# Patient Record
Sex: Female | Born: 1995 | Hispanic: Yes | Marital: Married | Smoking: Never smoker
Health system: Southern US, Community
[De-identification: ages and names within clinical notes are randomized; demographics above are authoritative.]

## PROBLEM LIST (undated history)

## (undated) DIAGNOSIS — R519 Headache, unspecified: Secondary | ICD-10-CM

## (undated) DIAGNOSIS — K219 Gastro-esophageal reflux disease without esophagitis: Secondary | ICD-10-CM

## (undated) HISTORY — DX: Headache, unspecified: R51.9

## (undated) HISTORY — PX: NO PAST SURGERIES: SHX2092

## (undated) HISTORY — DX: Gastro-esophageal reflux disease without esophagitis: K21.9

---

## 2018-03-21 ENCOUNTER — Emergency Department: Payer: Medicaid Other

## 2018-03-21 ENCOUNTER — Emergency Department
Admission: EM | Admit: 2018-03-21 | Discharge: 2018-03-21 | Disposition: A | Discharge status: Home / Self Care | Payer: Medicaid Other | Attending: Emergency Medicine | Admitting: Emergency Medicine

## 2018-03-21 ENCOUNTER — Other Ambulatory Visit: Payer: Self-pay

## 2018-03-21 DIAGNOSIS — O3410 Maternal care for benign tumor of corpus uteri, unspecified trimester: Secondary | ICD-10-CM

## 2018-03-21 DIAGNOSIS — O4690 Antepartum hemorrhage, unspecified, unspecified trimester: Secondary | ICD-10-CM

## 2018-03-21 DIAGNOSIS — D259 Leiomyoma of uterus, unspecified: Secondary | ICD-10-CM | POA: Diagnosis not present

## 2018-03-21 DIAGNOSIS — O209 Hemorrhage in early pregnancy, unspecified: Secondary | ICD-10-CM | POA: Diagnosis present

## 2018-03-21 DIAGNOSIS — Z3A13 13 weeks gestation of pregnancy: Secondary | ICD-10-CM | POA: Diagnosis not present

## 2018-03-21 DIAGNOSIS — O9989 Other specified diseases and conditions complicating pregnancy, childbirth and the puerperium: Secondary | ICD-10-CM | POA: Insufficient documentation

## 2018-03-21 LAB — HCG, QUANTITATIVE, PREGNANCY: hCG, Beta Chain, Quant, S: 69189 m[IU]/mL — ABNORMAL HIGH (ref <5)

## 2018-03-21 LAB — POCT PREGNANCY, URINE: Preg Test, Ur: POSITIVE — AB

## 2018-03-21 LAB — ABO/RH: ABO/RH(D): O POS

## 2018-03-21 NOTE — ED Provider Notes (Signed)
[facility] Emergency Department Provider Note  ____________________________________________  Time seen: Approximately 10:57 PM  I have reviewed the triage vital signs and the nursing notes.   HISTORY  Chief Complaint Vaginal Bleeding    HPI Caroline Cooke is a 22 y.o. female who presents to the emergency department for treatment and evaluation of vaginal bleeding.  Patient is  approximately [redacted] weeks pregnant.  Her last menstrual cycle was 2 months ago.  Positive home pregnancy test 1 month ago.  She has not seen OB/GYN.  Gravida 1 para 0  History reviewed. No pertinent past medical history.  There are no active problems to display for this patient.   History reviewed. No pertinent surgical history.  Prior to Admission medications   Not on File    Allergies Patient has no allergy information on record.  No family history on file.  Social History Social History   Tobacco Use  . Smoking status: Not on file  Substance Use Topics  . Alcohol use: Not on file  . Drug use: Not on file    Review of Systems Constitutional: Negative for fever. Respiratory: Negative for shortness of breath or cough. Gastrointestinal: Negative for abdominal pain; negative for nausea , negative for vomiting. Genitourinary: Negative for dysuria , positive for vaginal bleeding. Musculoskeletal: Negative for back pain. Skin: Negative for acute skin changes/rash/lesion. ____________________________________________   PHYSICAL EXAM:  VITAL SIGNS: ED Triage Vitals  Enc Vitals Group     BP 03/21/18 2024 119/67     Pulse Rate 03/21/18 2024 93     Resp 03/21/18 2024 18     Temp 03/21/18 2024 99.9 F (37.7 C)     Temp Source 03/21/18 2024 Oral     SpO2 03/21/18 2024 100 %     Weight 03/21/18 2024 155 lb (70.3 kg)     Height 03/21/18 2024 5\' 1"  (1.549 m)     Head Circumference --      Peak Flow --      Pain Score 03/21/18 2025 0     Pain Loc --      Pain  Edu? --      Excl. in [place]? --     Constitutional: Alert and oriented. Well appearing and in no acute distress. Eyes: Conjunctivae are normal. Head: Atraumatic. Nose: No congestion/rhinnorhea. Mouth/Throat: Mucous membranes are moist. Respiratory: Normal respiratory effort.  No retractions. Gastrointestinal: Bowel sounds active x 4; Abdomen is soft without rebound or guarding. Genitourinary: Pelvic exam: Deferred Musculoskeletal: No extremity tenderness nor edema.  Neurologic:  Normal speech and language. No gross focal neurologic deficits are appreciated. Speech is normal. No gait instability. Skin:  Skin is warm, dry and intact. No rash noted on exposed skin. Psychiatric: Mood and affect are normal. Speech and behavior are normal.  ____________________________________________   LABS (all labs ordered are listed, but only abnormal results are displayed)  Labs Reviewed  HCG, QUANTITATIVE, PREGNANCY - Abnormal; Notable for the following components:      Result Value   hCG, Beta Chain, Quant, S 69,189 (*)    All other components within normal limits  POCT PREGNANCY, URINE - Abnormal; Notable for the following components:   Preg Test, Ur POSITIVE (*)    All other components within normal limits  POC URINE PREG, ED  ABO/RH   ____________________________________________  RADIOLOGY  OB ultrasound shows a single live IUP 13 weeks 4 days with heart rate of 163, cardiac activity is present, intrauterine gestational sac is also present.  No visualized subchorionic hemorrhage.  There is a small 3.7 cm hypoechoic left probable intramural leiomyoma. ____________________________________________  Procedures  ____________________________________________  22 year old female presenting to the emergency department for treatment and evaluation of vaginal bleeding during pregnancy.  She thought she was [redacted] weeks pregnant, however according to ultrasound she is 13 weeks and 4 days.  Ultrasound does  show a 3.7 cm lesion which likely resembles a leiomyoma.  Patient was advised to begin prenatal vitamins, pelvic rest was advised, and she was advised to follow-up with OB/GYN.  Strict ER return precautions were given and she will return for any heavy vaginal bleeding or passing clots if she is unable to see gynecology.  INITIAL IMPRESSION / ASSESSMENT AND PLAN / ED COURSE  Pertinent labs & imaging results that were available during my care of the patient were reviewed by me and considered in my medical decision making (see chart for details).  ____________________________________________   FINAL CLINICAL IMPRESSION(S) / ED DIAGNOSES  Final diagnoses:  Vaginal bleeding in pregnancy  Uterine fibroid in pregnancy    Note:  This document was prepared using Dragon voice recognition software and may include unintentional dictation errors.    Victorino Dike, FNP 03/21/18 [place], [place], MD 03/24/18 [phone]

## 2018-03-21 NOTE — ED Notes (Signed)
Patient transported to Ultrasound 

## 2018-03-21 NOTE — ED Notes (Signed)
Pt assisted to wheelchair by family upon arrival; pt reports vaginal bleeding; unsure if she is pregnant

## 2018-03-21 NOTE — ED Triage Notes (Signed)
Pt comes via POV from home with c/o vaginal bleeding that started about 30 minutes ago. Pt reports cramping and clots.  Pt reports last period about 2 months ago. Pt reports positive home pregnancy test taken about a month ago. Pt has not seen OBGYN since positive pregnancy test.

## 2018-05-14 ENCOUNTER — Encounter: Payer: Self-pay | Admitting: Radiology

## 2020-02-12 IMAGING — US US OB COMP LESS 14 WK
1 series · 14 of 28 positions shown · non-contrast
Comparison: None.

CLINICAL DATA: Vaginal bleeding. Gestational age by last menstrual
period 9 weeks and 5 days.

EXAM:
OBSTETRIC <14 WK ULTRASOUND
TECHNIQUE: Transabdominal ultrasound was performed for evaluation of the
gestation as well as the maternal uterus and adnexal regions.

[Series 1: us ob comp less 14 wk · 14 of 55 slices shown]
[im 3/55]
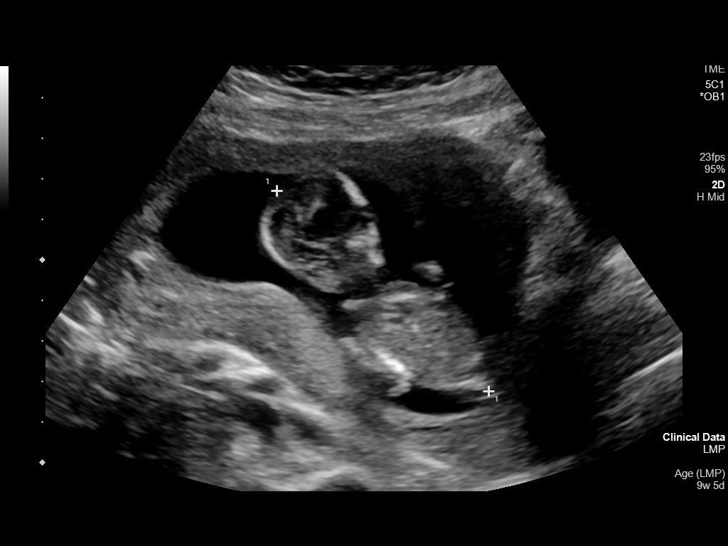
[im 7/55]
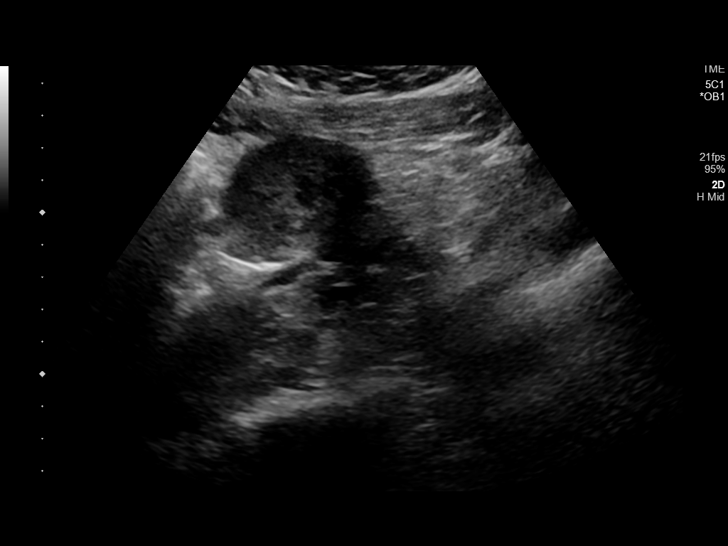
[im 11/55]
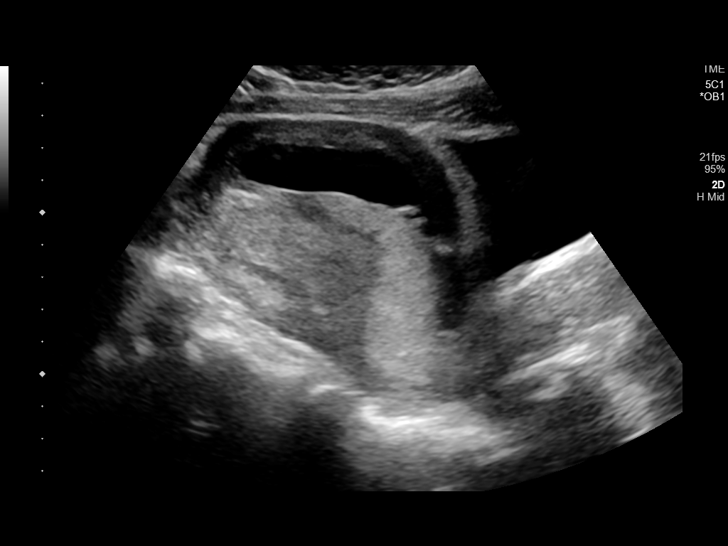
[im 15/55]
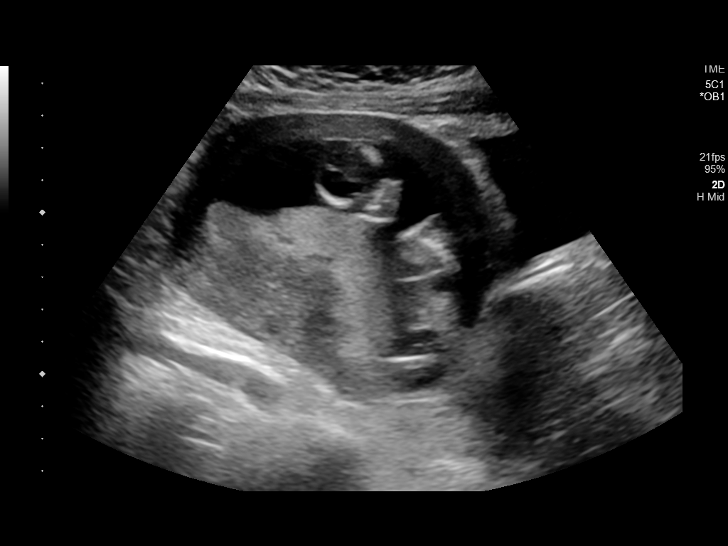
[im 19/55]
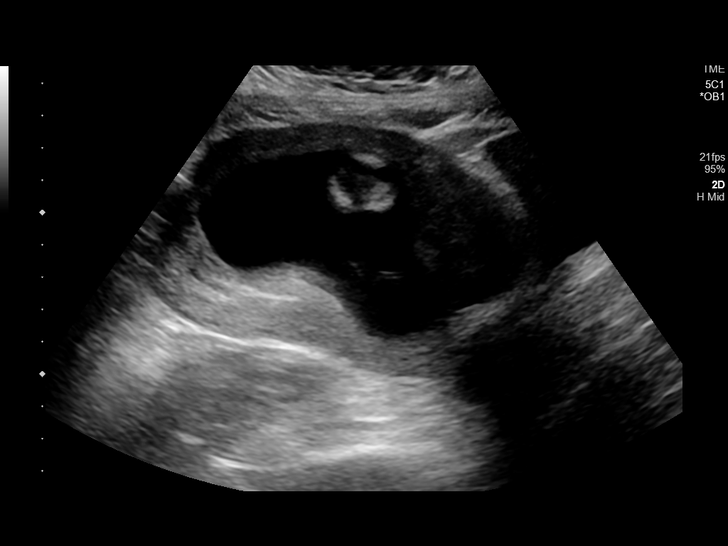
[im 23/55]
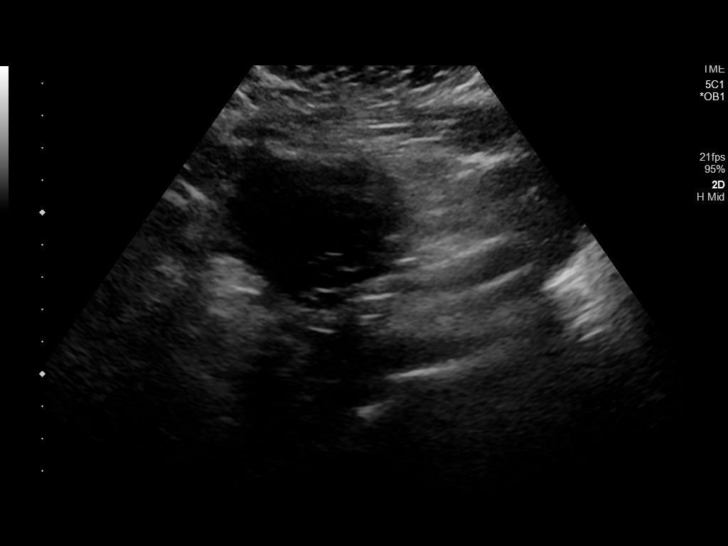
[im 27/55]
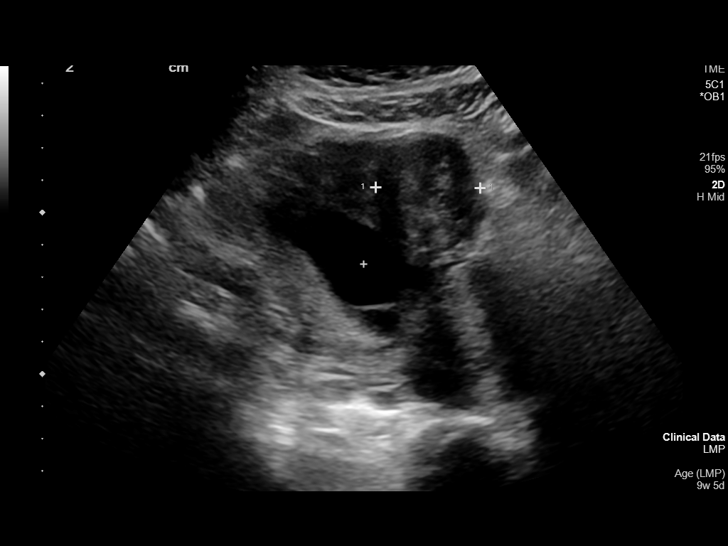
[im 31/55]
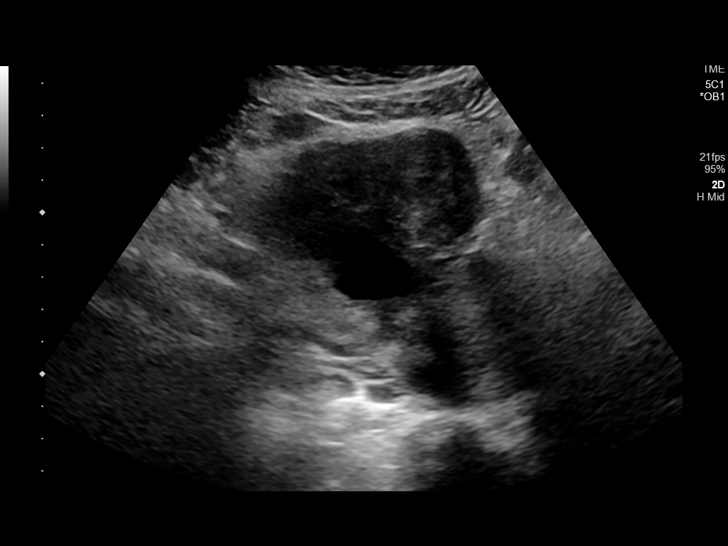
[im 35/55]
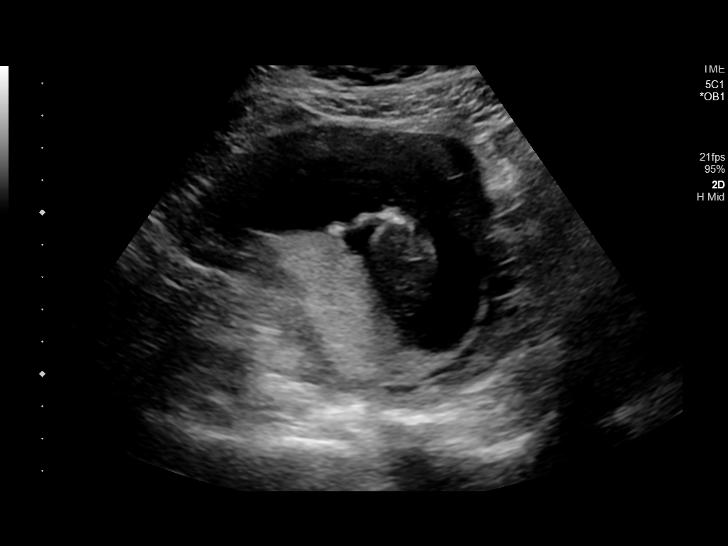
[im 39/55]
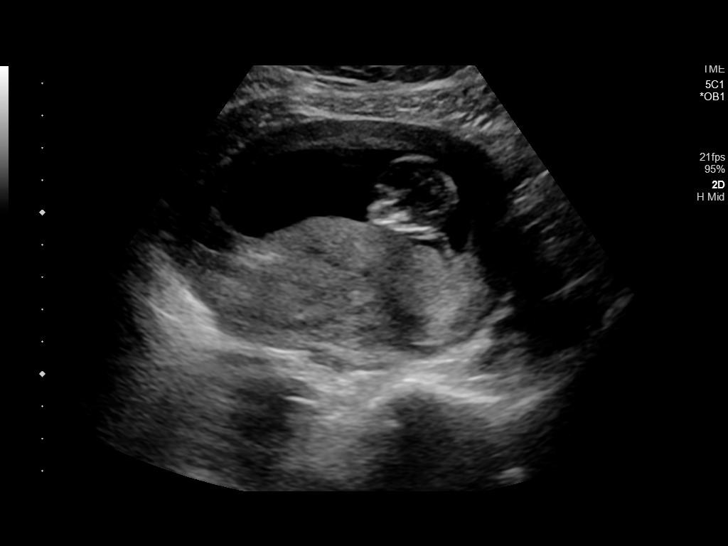
[im 43/55]
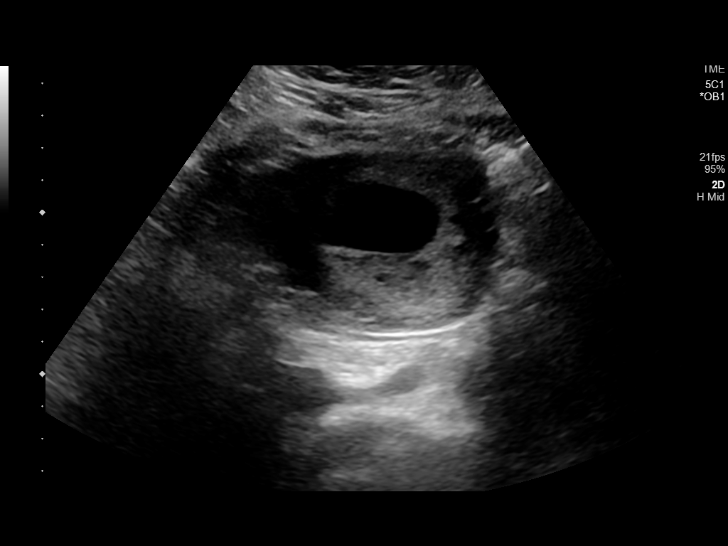
[im 47/55]
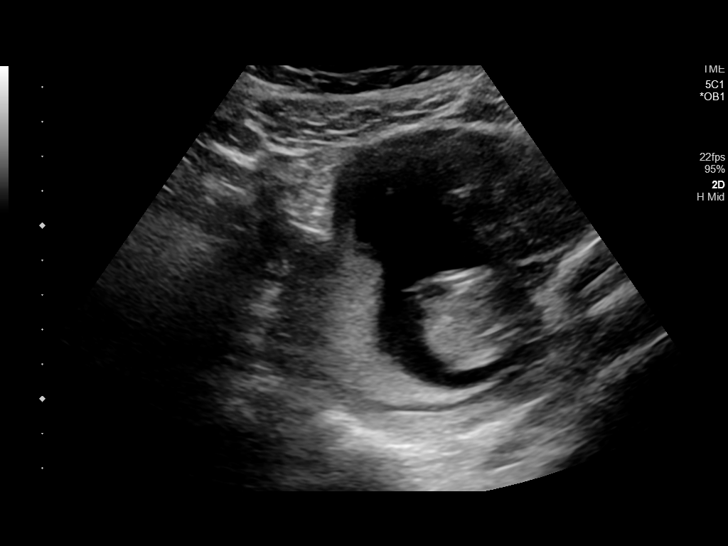
[im 51/55]
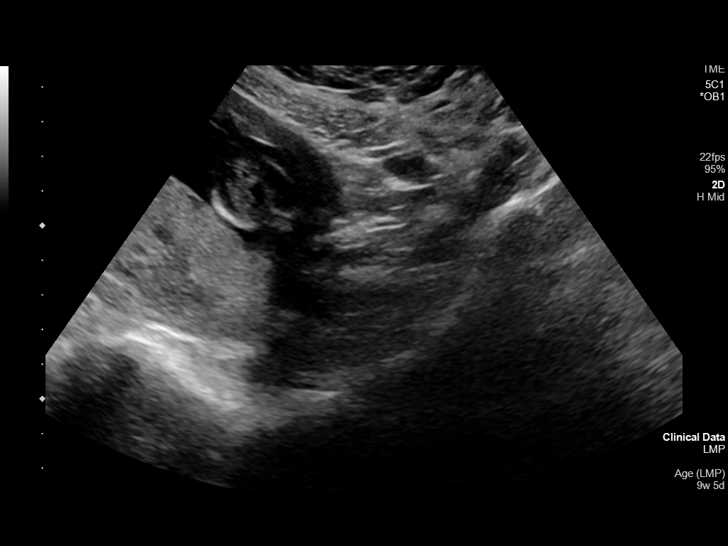
[im 55/55]
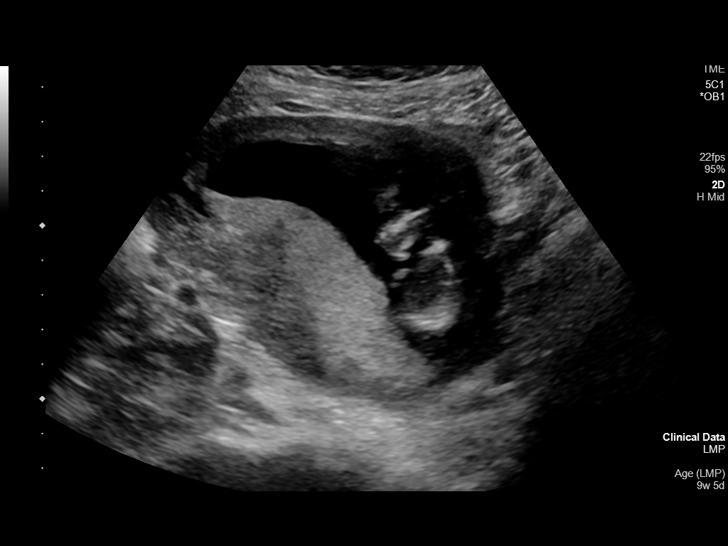

[14 of 28 positions shown; findings below may reference images not displayed]

FINDINGS: Intrauterine gestational sac: Present.

Yolk sac:  Not present.

Embryo:  Present.

Cardiac Activity: Present.

Heart Rate: 163 bpm

CRL:   73.9 mm   13 w for d                  US EDC: September 22, 2018

Subchorionic hemorrhage:  None visualized.

Maternal uterus/adnexae: Posterior placenta. 3.7 cm hypoechoic LEFT
probable intramural leiomyoma. Ovaries not sonographically
identified. No free fluid.
IMPRESSION: Single live intrauterine pregnancy, gestational age by ultrasound 13
weeks and 4 days without immediate complication.

## 2020-11-17 ENCOUNTER — Other Ambulatory Visit: Payer: Self-pay

## 2020-11-17 ENCOUNTER — Ambulatory Visit (LOCAL_COMMUNITY_HEALTH_CENTER): Payer: Medicaid Other

## 2020-11-17 VITALS — BP 112/77 | HR 80 | Resp 16 | Ht 60.0 in | Wt 167.0 lb

## 2020-11-17 DIAGNOSIS — Z3201 Encounter for pregnancy test, result positive: Secondary | ICD-10-CM

## 2020-11-17 LAB — PREGNANCY, URINE: Preg Test, Ur: POSITIVE — AB

## 2020-11-17 MED ORDER — PRENATAL 27-0.8 MG PO TABS: 1.0000 | ORAL_TABLET | Freq: Every day | ORAL | 0 refills | Status: AC

## 2020-11-17 NOTE — Progress Notes (Signed)
UPT positive. Plans prenatal care at [facility]. To clerk for preadmit. 

## 2020-12-15 NOTE — Progress Notes (Signed)
OB abstract completed per phone interview 12/15/20.  Pt aware of new OB appt scheduled for 12/22/20 8am Milagros Reap RN

## 2020-12-22 ENCOUNTER — Ambulatory Visit: Payer: Medicaid Other | Admitting: Family Medicine

## 2020-12-22 ENCOUNTER — Encounter: Payer: Self-pay | Admitting: Family Medicine

## 2020-12-22 ENCOUNTER — Other Ambulatory Visit: Payer: Self-pay

## 2020-12-22 VITALS — BP 110/74 | HR 77 | Temp 97.7°F | Ht 60.5 in | Wt 166.0 lb

## 2020-12-22 DIAGNOSIS — O99211 Obesity complicating pregnancy, first trimester: Secondary | ICD-10-CM

## 2020-12-22 DIAGNOSIS — O0990 Supervision of high risk pregnancy, unspecified, unspecified trimester: Secondary | ICD-10-CM | POA: Insufficient documentation

## 2020-12-22 DIAGNOSIS — Z3481 Encounter for supervision of other normal pregnancy, first trimester: Secondary | ICD-10-CM | POA: Diagnosis not present

## 2020-12-22 DIAGNOSIS — O99210 Obesity complicating pregnancy, unspecified trimester: Secondary | ICD-10-CM

## 2020-12-22 DIAGNOSIS — Z3480 Encounter for supervision of other normal pregnancy, unspecified trimester: Secondary | ICD-10-CM

## 2020-12-22 LAB — URINALYSIS
Bilirubin, UA: NEGATIVE
Glucose, UA: NEGATIVE
Ketones, UA: NEGATIVE
Leukocytes,UA: NEGATIVE
Nitrite, UA: NEGATIVE
Protein,UA: NEGATIVE
RBC, UA: NEGATIVE
Specific Gravity, UA: 1.02 (ref 1.005–1.030)
Urobilinogen, Ur: 0.2 mg/dL (ref 0.2–1.0)
pH, UA: 7 (ref 5.0–7.5)

## 2020-12-22 LAB — HEMOGLOBIN, FINGERSTICK: Hemoglobin: 13.6 g/dL (ref 11.1–15.9)

## 2020-12-22 LAB — WET PREP FOR TRICH, YEAST, CLUE
Trichomonas Exam: NEGATIVE
Yeast Exam: NEGATIVE

## 2020-12-22 NOTE — Progress Notes (Signed)
Patient hgb, urine dip, wet mount reviewed, no new orders. Dental list given and patient counseled to make a dental appointment for routine dental care. Patient counseled to expect phone call from [facility] for U/S appointment. Copy of covid card sent for scanning.Jenetta Downer, RN

## 2020-12-22 NOTE — Progress Notes (Signed)
Patient here for new OB visit at 11 weeks. Last Pap 05/01/2018, NIL. Normal Hgb, 05/01/2018. Denies pregnancy care or U/S during this appt. Lives with husband and 26 year old. PPDR in 2017, 22m, denies travel since then..Marland KitchenMarland KitchenJenetta Downer RN

## 2020-12-22 NOTE — Progress Notes (Signed)
[place] DEPT [facility] [place] [address] [phone]  INITIAL PRENATAL VISIT NOTE  Subjective:  Caroline Cooke is a 25 y.o. G2P1001 at 71w0dbeing [facility] today [facility]  She is currently monitored for the following issues for this low-risk pregnancy and has Supervision of other normal pregnancy, antepartum and Obesity during pregnancy, antepartum on their problem list.  Patient reports nausea.  Contractions: Not present. Vag. Bleeding: None.  Movement: Absent. Denies leaking of fluid.   Indications for ASA therapy (per uptodate) One of the following: Previous pregnancy with preeclampsia, especially early onset and with an adverse outcome No Multifetal gestation No Chronic hypertension No Type 1 or 2 diabetes mellitus No Chronic kidney disease No Autoimmune disease (antiphospholipid syndrome, systemic lupus erythematosus) No  Two or more of the following: Nulliparity No Obesity (body mass index >30 kg/m2) Yes Family history of preeclampsia in mother or sister No Age ?35 years No Sociodemographic characteristics (African American race, low socioeconomic level) Yes Personal risk factors (eg, previous pregnancy with low birth weight or small for gestational age infant, previous adverse pregnancy outcome [eg, stillbirth], interval >10 years between pregnancies) No   The following portions of the patient's history were reviewed and updated as appropriate: allergies, current medications, past family history, past medical history, past social history, past surgical history and problem list. Problem list updated.  Objective:   Vitals:   12/22/20 0941 12/22/20 0950  BP: 110/74   Pulse: 77   Temp: 97.7 F (36.5 C)   Weight: 166 lb (75.3 kg)   Height:  5' 0.5" (1.537 m)    Fetal Status:   Fundal Height: 11 cm Movement: Absent  Presentation:  Undeterminable   Physical Exam Vitals and nursing note reviewed.  Constitutional:      General: She is not in acute distress.    Appearance: Normal appearance. She is well-developed.  HENT:     Head: Normocephalic and atraumatic.     Right Ear: External ear normal.     Left Ear: External ear normal.     Nose: Nose normal. No congestion or rhinorrhea.     Mouth/Throat:     Lips: Pink.     Mouth: Mucous membranes are moist.     Dentition: Normal dentition. No dental caries.     Pharynx: Oropharynx is clear. Uvula midline.     Comments: Dentition: no visible caries  Eyes:     General: No scleral icterus.    Conjunctiva/sclera: Conjunctivae normal.  Neck:     Thyroid: No thyroid mass or thyromegaly.  Cardiovascular:     Rate and Rhythm: Normal rate and regular rhythm.     Pulses: Normal pulses.     Heart sounds: Normal heart sounds.     Comments: Extremities are warm and well perfused Pulmonary:     Effort: Pulmonary effort is normal.     Breath sounds: Normal breath sounds.  Chest:     Chest wall: No mass.  Breasts:    Tanner Score is 5.     Breasts are symmetrical.     Right: Normal. No mass, nipple discharge, skin change or axillary adenopathy.     Left: Normal. No mass, nipple discharge, skin change or axillary adenopathy.  Abdominal:     General: Abdomen is flat.     Palpations: Abdomen is soft.     Tenderness: There is no abdominal tenderness.  Comments: Gravid   Genitourinary:    General: Normal vulva.     Exam position: Lithotomy position.     Pubic Area: No rash.      Labia:        Right: No rash.        Left: No rash.      Vagina: Normal. No vaginal discharge.     Cervix: No cervical motion tenderness or friability.     Uterus: Normal. Enlarged (Gravid 11 size). Not tender.      Adnexa: Right adnexa normal and left adnexa normal.     Rectum: Normal. No external hemorrhoid.     Comments: External genitalia without, lice, nits, erythema, edema , lesions  or inguinal adenopathy. Vagina with normal mucosa and white discharge and pH equals 4.  Cervix without visual lesions, uterus firm, mobile, non-tender, no masses, CMT adnexal fullness or tenderness.   Musculoskeletal:     Cervical back: Normal range of motion and neck supple.     Right lower leg: No edema.     Left lower leg: No edema.  Lymphadenopathy:     Cervical: No cervical adenopathy.     Upper Body:     Right upper body: No axillary adenopathy.     Left upper body: No axillary adenopathy.  Skin:    General: Skin is warm and dry.     Capillary Refill: Capillary refill takes less than 2 seconds.  Neurological:     Mental Status: She is alert and oriented to person, place, and time.  Psychiatric:        Behavior: Behavior normal.    Assessment and Plan:  Pregnancy: G2P1001 at [redacted]w[redacted]d 1. Supervision of other normal pregnancy, antepartum   # DX Supervision of Pregnancy   - Dating: has sure LMP 10/06/2020, [place]  - Genetic screening: desires FIRST, referral placed  - Pregnancy sx: has N/V- counseling given if it developed  - last dental visit  was 5 years ago.  Reviewed safety of routine care in pregnancy.   - Has support system through husband - Routine labs today  - Vaccinations: has had 2 of covid vaccines   - Has minor risk factors for preeclampsia. Recommended ASA '81mg'$  daily for preeclampsia prevention. Discussed starting at 11-13 weeks and continuing through pregnancy. We will touch base at next appointment to see if she started this medication.    - Chlamydia/GC NAA, Confirmation - Comprehensive metabolic panel - Glucose tolerance, 1 hour - HCV Ab w Reflex to Quant PCR - Hgb A1c w/o eAG - HIV-1/HIV-2 Qualitative RNA - Lead, blood (adult age 5454yrs or greater) - Prenatal profile without Varicella or Rubella - Varicella zoster antibody, IgG - TSH - Urine Culture - Protein / creatinine ratio, urine - Urinalysis (Urine Dip) - Hemoglobin, venipuncture -  WET PREP FOR TRICH, YEAST, CLUE - [id number]+Oxycodone-Bund  2. Obesity during pregnancy, antepartum Discussed diet and exercise Recommended Weight gain of 11-20 lbs   - Amb ref to Medical Nutrition Therapy-MNT    Discussed overview of care and coordination with inpatient delivery practices including [facility], [facility] and [facility]   .    Preterm labor symptoms and general obstetric precautions including but not limited to vaginal bleeding, contractions, leaking of fluid and fetal movement were reviewed in detail with the patient.  Please refer to After Visit Summary for other counseling recommendations.   Return in about 4 weeks (around 01/19/2021) for routine prenatal care.  Future  Appointments  Date Time Provider [place]  01/19/2021 10:40 AM AC-MH PROVIDER [facility] None    Junious Dresser, FNP

## 2020-12-23 LAB — CBC/D/PLT+RPR+RH+ABO+AB SCR
Antibody Screen: NEGATIVE
Basophils Absolute: 0 10*3/uL (ref 0.0–0.2)
Basos: 0 %
EOS (ABSOLUTE): 0 10*3/uL (ref 0.0–0.4)
Eos: 0 %
Hematocrit: 40.4 % (ref 34.0–46.6)
Hemoglobin: 13.2 g/dL (ref 11.1–15.9)
Hepatitis B Surface Ag: NEGATIVE
Immature Grans (Abs): 0 10*3/uL (ref 0.0–0.1)
Immature Granulocytes: 0 %
Lymphocytes Absolute: 1.8 10*3/uL (ref 0.7–3.1)
Lymphs: 21 %
MCH: 28.5 pg (ref 26.6–33.0)
MCHC: 32.7 g/dL (ref 31.5–35.7)
MCV: 87 fL (ref 79–97)
Monocytes Absolute: 0.5 10*3/uL (ref 0.1–0.9)
Monocytes: 6 %
Neutrophils Absolute: 6.4 10*3/uL (ref 1.4–7.0)
Neutrophils: 73 %
Platelets: 317 10*3/uL (ref 150–450)
RBC: 4.63 x10E6/uL (ref 3.77–5.28)
RDW: 14.1 % (ref 11.7–15.4)
RPR Ser Ql: NONREACTIVE
Rh Factor: POSITIVE
WBC: 8.8 10*3/uL (ref 3.4–10.8)

## 2020-12-23 LAB — 789231 7+OXYCODONE-BUND
Amphetamines, Urine: NEGATIVE ng/mL
BENZODIAZ UR QL: NEGATIVE ng/mL
Barbiturate screen, urine: NEGATIVE ng/mL
Cannabinoid Quant, Ur: NEGATIVE ng/mL
Cocaine (Metab.): NEGATIVE ng/mL
OPIATE SCREEN URINE: NEGATIVE ng/mL
Oxycodone/Oxymorphone, Urine: NEGATIVE ng/mL
PCP Quant, Ur: NEGATIVE ng/mL

## 2020-12-23 LAB — HCV INTERPRETATION

## 2020-12-23 LAB — PROTEIN / CREATININE RATIO, URINE
Creatinine, Urine: 92.2 mg/dL
Protein, Ur: 10.1 mg/dL
Protein/Creat Ratio: 110 mg/g creat (ref 0–200)

## 2020-12-23 LAB — HCV AB W REFLEX TO QUANT PCR: HCV Ab: <0.1 s/co ratio (ref 0.0–0.9)

## 2020-12-25 LAB — CHLAMYDIA/GC NAA, CONFIRMATION
Chlamydia trachomatis, NAA: NEGATIVE
Neisseria gonorrhoeae, NAA: NEGATIVE

## 2020-12-25 LAB — URINE CULTURE

## 2020-12-27 LAB — COMPREHENSIVE METABOLIC PANEL
ALT: 14 IU/L (ref 0–32)
AST: 15 IU/L (ref 0–40)
Albumin/Globulin Ratio: 2 (ref 1.2–2.2)
Albumin: 4.5 g/dL (ref 3.9–5.0)
Alkaline Phosphatase: 60 IU/L (ref 44–121)
BUN/Creatinine Ratio: 15 (ref 9–23)
BUN: 7 mg/dL (ref 6–20)
Bilirubin Total: 0.3 mg/dL (ref 0.0–1.2)
CO2: 21 mmol/L (ref 20–29)
Calcium: 9.5 mg/dL (ref 8.7–10.2)
Chloride: 100 mmol/L (ref 96–106)
Creatinine, Ser: 0.48 mg/dL — ABNORMAL LOW (ref 0.57–1.00)
Globulin, Total: 2.3 g/dL (ref 1.5–4.5)
Glucose: 68 mg/dL (ref 65–99)
Potassium: 3.7 mmol/L (ref 3.5–5.2)
Sodium: 137 mmol/L (ref 134–144)
Total Protein: 6.8 g/dL (ref 6.0–8.5)
eGFR: 134 mL/min/{1.73_m2} (ref >59)

## 2020-12-27 LAB — HGB A1C W/O EAG: Hgb A1c MFr Bld: 4.9 % (ref 4.8–5.6)

## 2020-12-27 LAB — TSH: TSH: 0.745 u[IU]/mL (ref 0.450–4.500)

## 2020-12-27 LAB — HIV-1/HIV-2 QUALITATIVE RNA
HIV-1 RNA, Qualitative: NONREACTIVE
HIV-2 RNA, Qualitative: NONREACTIVE

## 2020-12-27 LAB — VARICELLA ZOSTER ANTIBODY, IGG: Varicella zoster IgG: <135 index — ABNORMAL LOW (ref >165)

## 2020-12-27 LAB — LEAD, BLOOD (ADULT >= 16 YRS): Lead-Whole Blood: <1 ug/dL (ref 0–4)

## 2020-12-27 LAB — GLUCOSE, 1 HOUR GESTATIONAL: Gestational Diabetes Screen: 66 mg/dL (ref 65–139)

## 2021-01-01 NOTE — Addendum Note (Signed)
Addended by: Cletis Media on: 01/01/2021 09:26 AM   Modules accepted: Orders

## 2021-01-18 ENCOUNTER — Encounter: Payer: Self-pay | Admitting: Advanced Practice Midwife

## 2021-01-18 DIAGNOSIS — Z3480 Encounter for supervision of other normal pregnancy, unspecified trimester: Secondary | ICD-10-CM

## 2021-01-19 ENCOUNTER — Ambulatory Visit: Payer: Medicaid Other | Admitting: Advanced Practice Midwife

## 2021-01-19 ENCOUNTER — Other Ambulatory Visit: Payer: Self-pay

## 2021-01-19 VITALS — BP 114/75 | HR 94 | Temp 98.2°F | Wt 169.0 lb

## 2021-01-19 DIAGNOSIS — Z3480 Encounter for supervision of other normal pregnancy, unspecified trimester: Secondary | ICD-10-CM

## 2021-01-19 DIAGNOSIS — O99210 Obesity complicating pregnancy, unspecified trimester: Secondary | ICD-10-CM

## 2021-01-19 DIAGNOSIS — O99212 Obesity complicating pregnancy, second trimester: Secondary | ICD-10-CM

## 2021-01-19 DIAGNOSIS — Z3482 Encounter for supervision of other normal pregnancy, second trimester: Secondary | ICD-10-CM

## 2021-01-19 NOTE — Progress Notes (Signed)
Patient here for MH RV at 15 weeks. Patient counseled on varicella non-immune status and need for vaccine PP. AFP only today.Jenetta Downer, RN

## 2021-01-19 NOTE — Progress Notes (Signed)
   PRENATAL VISIT NOTE  Subjective:  Caroline Cooke is a 25 y.o. G2P1001 at 85w0dbeing seen today for ongoing prenatal care.  She is currently monitored for the following issues for this low-risk pregnancy and has Supervision of other normal pregnancy, antepartum and Obesity during pregnancy, antepartum on their problem list.  Patient reports no complaints.  Contractions: Not present. Vag. Bleeding: None.  Movement: Absent. Denies leaking of fluid/ROM.   The following portions of the patient's history were reviewed and updated as appropriate: allergies, current medications, past family history, past medical history, past social history, past surgical history and problem list. Problem list updated.  Objective:   Vitals:   01/19/21 1030  BP: 114/75  Pulse: 94  Temp: 98.2 F (36.8 C)  Weight: 169 lb (76.7 kg)    Fetal Status: Fetal Heart Rate (bpm): 150 Fundal Height: 18 cm Movement: Absent     General:  Alert, oriented and cooperative. Patient is in no acute distress.  Skin: Skin is warm and dry. No rash noted.   Cardiovascular: Normal heart rate noted  Respiratory: Normal respiratory effort, no problems with respiration noted  Abdomen: Soft, gravid, appropriate for gestational age.  Pain/Pressure: Absent     Pelvic: Cervical exam deferred        Extremities: Normal range of motion.  Edema: None  Mental Status: Normal mood and affect. Normal behavior. Normal judgment and thought content.   Assessment and Plan:  Pregnancy: G2P1001 at 129w0d1. 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 Obesity during pregnancy, antepartum 9 lb (4.082 kg) Taking ASA 81 mg daily Walking 3x/wk x 30 min   - AFP, Serum, Open Spina Bifida   Preterm labor  symptoms and general obstetric precautions including but not limited to vaginal bleeding, contractions, leaking of fluid and fetal movement were reviewed in detail with the patient. Please refer to After Visit Summary for other counseling recommendations.  Return in about 4 weeks (around 02/16/2021) for routine PNC.  No future appointments.  ElHerbie SaxonCNM

## 2021-01-21 LAB — AFP, SERUM, OPEN SPINA BIFIDA
AFP MoM: 0.78
AFP Value: 22.1 ng/mL
Gest. Age on Collection Date: 15 weeks
Maternal Age At EDD: 26.7 yr
OSBR Risk 1 IN: 10000
Test Results:: NEGATIVE
Weight: 169 [lb_av]

## 2021-02-16 ENCOUNTER — Ambulatory Visit: Payer: Medicaid Other | Admitting: Family Medicine

## 2021-02-16 ENCOUNTER — Other Ambulatory Visit: Payer: Self-pay

## 2021-02-16 VITALS — BP 101/67 | HR 79 | Temp 98.3°F | Wt 169.2 lb

## 2021-02-16 DIAGNOSIS — O99210 Obesity complicating pregnancy, unspecified trimester: Secondary | ICD-10-CM

## 2021-02-16 DIAGNOSIS — Z23 Encounter for immunization: Secondary | ICD-10-CM

## 2021-02-16 DIAGNOSIS — O99212 Obesity complicating pregnancy, second trimester: Secondary | ICD-10-CM

## 2021-02-16 DIAGNOSIS — Z3482 Encounter for supervision of other normal pregnancy, second trimester: Secondary | ICD-10-CM

## 2021-02-16 DIAGNOSIS — Z3480 Encounter for supervision of other normal pregnancy, unspecified trimester: Secondary | ICD-10-CM

## 2021-02-16 NOTE — Progress Notes (Signed)
Aware of 02/23/2021 [facility] appt at 1000. Rich Number, RN

## 2021-02-16 NOTE — Progress Notes (Signed)
[place] [facility]  PRENATAL VISIT NOTE  Subjective:  Caroline Cooke is a 25 y.o. G2P1001 at [redacted]w[redacted]d being seen today for ongoing prenatal care.  She is currently monitored for the following issues for this low-risk pregnancy and has Supervision of other normal pregnancy, antepartum and Obesity during pregnancy, antepartum on their problem list.  Patient reports no complaints.  Contractions: Not present. Vag. Bleeding: None.  Movement: Present. Denies leaking of fluid/ROM.   The following portions of the patient's history were reviewed and updated as appropriate: allergies, current medications, past family history, past medical history, past social history, past surgical history and problem list. Problem list updated.  Objective:   Vitals:   02/16/21 0827  BP: 101/67  Pulse: 79  Temp: 98.3 F (36.8 C)  Weight: 169 lb 3.2 oz (76.7 kg)    Fetal Status: Fetal Heart Rate (bpm): 147 Fundal Height: 20 cm Movement: Present     General:  Alert, oriented and cooperative. Patient is in no acute distress.  Skin: Skin is warm and dry. No rash noted.   Cardiovascular: Normal heart rate noted  Respiratory: Normal respiratory effort, no problems with respiration noted  Abdomen: Soft, gravid, appropriate for gestational age.  Pain/Pressure: Absent     Pelvic: Cervical exam deferred        Extremities: Normal range of motion.  Edema: None  Mental Status: Normal mood and affect. Normal behavior. Normal judgment and thought content.   Assessment and Plan:  Pregnancy: G2P1001 at [redacted]w[redacted]d  1. Supervision of other normal pregnancy, antepartum - taking ASA and PNV as directed  -denies N/V -aware of U/A appointment for 02/23/21   2. Obesity during pregnancy, antepartum - not walking as much as before, encouraged to continue to walk at minimum 3 x per week for 30 mins as before.   - discussed appropriate weight gain 11-20 lbs during this pregnancy      Preterm labor symptoms and general obstetric precautions including but not limited to vaginal bleeding, contractions, leaking of fluid and fetal movement were reviewed in detail with the patient. Please refer to After Visit Summary for other counseling recommendations.  Return in about 4 weeks (around 03/16/2021) for routine prenatal care.  Future Appointments  Date Time Provider [place]  03/16/2021  8:20 AM [facility]-MH PROVIDER [facility] None    Junious Dresser, FNP

## 2021-02-28 ENCOUNTER — Encounter: Payer: Self-pay | Admitting: Advanced Practice Midwife

## 2021-02-28 DIAGNOSIS — O283 Abnormal ultrasonic finding on antenatal screening of mother: Secondary | ICD-10-CM | POA: Insufficient documentation

## 2021-02-28 DIAGNOSIS — Z8279 Family history of other congenital malformations, deformations and chromosomal abnormalities: Secondary | ICD-10-CM | POA: Insufficient documentation

## 2021-03-16 ENCOUNTER — Other Ambulatory Visit: Payer: Self-pay

## 2021-03-16 ENCOUNTER — Ambulatory Visit: Payer: Medicaid Other

## 2021-03-16 NOTE — Progress Notes (Unsigned)
Patient is here for MH RV at 23w 0d.   No provider available during this visit - patient agrees to proceed visit with RN.   Patient denies LOF, denies vaginal bleeding,and denies contractions.   Reports positive fetal movements.   PHQ9 (score =1) and CMHRP form filled out during this visit.   Vitals signs WNL.   Reports to taking ASA 81mg  and PN daily at night time.   Patient scheduled for next week 11/4 for MH RV to see a provider. All questions answered.    Adalberto Cole, RN

## 2021-03-23 ENCOUNTER — Ambulatory Visit: Payer: Medicaid Other

## 2021-03-23 ENCOUNTER — Telehealth: Payer: Self-pay

## 2021-03-23 NOTE — Telephone Encounter (Signed)
TC to patient to reschedule missed MH RV. LM with number to call. Interpreter, M. [place]Jenetta Downer, RN

## 2021-03-27 NOTE — Telephone Encounter (Signed)
Call to patient regarding missed appointment on 03/23/21.   Patient is unable to rescheduled this week due to work obligations. Patient can only come in Fridays (this Friday is a holiday - clinic closed).   Appointment scheduled for 04/06/21 for 0800 arrival.   Adalberto Cole, RN

## 2021-04-05 ENCOUNTER — Encounter: Payer: Self-pay | Admitting: Physician Assistant

## 2021-04-05 NOTE — Progress Notes (Unsigned)
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 Slight increase in dilation of R ureter is noted, and AFI is upper limits of normal. Pt declined genetic counseling and/or testing. Plan f/u fetal growth [place], then referral to peds urology. Continue with plan for referral to [facility].

## 2021-04-06 ENCOUNTER — Other Ambulatory Visit: Payer: Self-pay

## 2021-04-06 ENCOUNTER — Ambulatory Visit: Payer: Medicaid Other | Admitting: Advanced Practice Midwife

## 2021-04-06 VITALS — BP 89/56 | HR 82 | Temp 97.8°F | Wt 175.0 lb

## 2021-04-06 DIAGNOSIS — Z8279 Family history of other congenital malformations, deformations and chromosomal abnormalities: Secondary | ICD-10-CM

## 2021-04-06 DIAGNOSIS — Z3480 Encounter for supervision of other normal pregnancy, unspecified trimester: Secondary | ICD-10-CM

## 2021-04-06 DIAGNOSIS — O99210 Obesity complicating pregnancy, unspecified trimester: Secondary | ICD-10-CM

## 2021-04-06 DIAGNOSIS — O283 Abnormal ultrasonic finding on antenatal screening of mother: Secondary | ICD-10-CM

## 2021-04-06 NOTE — Progress Notes (Signed)
[place] [facility]  PRENATAL VISIT NOTE  Subjective:  Caroline Cooke is a 25 y.o. G2P1001 at [redacted]w[redacted]d being seen today for ongoing prenatal care.  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  Patient reports no complaints.  Contractions: Not present. Vag. Bleeding: None.  Movement: Present. Denies leaking of fluid/ROM.   The following portions of the patient's history were reviewed and updated as appropriate: allergies, current medications, past family history, past medical history, past social history, past surgical history and problem list. Problem list updated.  Objective:   Vitals:   04/06/21 0831  BP: (!) 89/56  Pulse: 82  Temp: 97.8 F (36.6 C)  Weight: 175 lb (79.4 kg)    Fetal Status: Fetal Heart Rate (bpm): 160 Fundal Height: 28 cm Movement: Present     General:  Alert, oriented and cooperative. Patient is in no acute distress.  Skin: Skin is warm and dry. No rash noted.   Cardiovascular: Normal heart rate noted  Respiratory: Normal respiratory effort, no problems with respiration noted  Abdomen: Soft, gravid, appropriate for gestational age.  Pain/Pressure: Absent     Pelvic: Cervical exam deferred        Extremities: Normal range of motion.  Edema: None  Mental Status: Normal mood and affect. Normal behavior. Normal judgment and thought content.   Assessment and Plan:  Pregnancy: G2P1001 at [redacted]w[redacted]d Seen by [place] 1. Obesity during pregnancy, antepartum 15 lb (6.804 kg) Walking 2-3x/wk x 15 min Taking ASA 81 mg daily  2. 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 Also recommend cord blood for postnatal chromosome analysis, DNA isolate, and chromosomal microarray analysis due to previous child with grade A1UTD  3. Supervision of other normal pregnancy, antepartum Feels well  4. Family history of congenital anomaly: previous child with grade A1 UTD     Preterm labor symptoms and general obstetric precautions including but not limited to vaginal bleeding, contractions, leaking of fluid and fetal movement were reviewed in detail with the patient. Please refer to After Visit Summary for other counseling recommendations.  Return in about 2 weeks (around 04/20/2021) for 28 week labs.  Future Appointments  Date Time Provider [place]  04/20/2021  2:20 PM [facility] PROVIDER [facility] None    Herbie Saxon, CNM

## 2021-04-19 DIAGNOSIS — Z2839 Other underimmunization status: Secondary | ICD-10-CM | POA: Insufficient documentation

## 2021-04-20 ENCOUNTER — Ambulatory Visit: Payer: Medicaid Other | Admitting: Advanced Practice Midwife

## 2021-04-20 ENCOUNTER — Other Ambulatory Visit: Payer: Self-pay

## 2021-04-20 VITALS — BP 91/53 | HR 90 | Temp 98.4°F | Wt 176.6 lb

## 2021-04-20 DIAGNOSIS — Z3480 Encounter for supervision of other normal pregnancy, unspecified trimester: Secondary | ICD-10-CM

## 2021-04-20 DIAGNOSIS — Z23 Encounter for immunization: Secondary | ICD-10-CM | POA: Diagnosis not present

## 2021-04-20 DIAGNOSIS — O283 Abnormal ultrasonic finding on antenatal screening of mother: Secondary | ICD-10-CM

## 2021-04-20 DIAGNOSIS — Z3483 Encounter for supervision of other normal pregnancy, third trimester: Secondary | ICD-10-CM

## 2021-04-20 DIAGNOSIS — O99210 Obesity complicating pregnancy, unspecified trimester: Secondary | ICD-10-CM

## 2021-04-20 DIAGNOSIS — O99213 Obesity complicating pregnancy, third trimester: Secondary | ICD-10-CM

## 2021-04-20 LAB — HEMOGLOBIN, FINGERSTICK: Hemoglobin: 11.5 g/dL (ref 11.1–15.9)

## 2021-04-20 NOTE — Progress Notes (Signed)
[place] [facility]  PRENATAL VISIT NOTE  Subjective:  Caroline Cooke is a 25 y.o. G2P1001 at [redacted]w[redacted]d being seen today for ongoing prenatal care.  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  Patient reports no complaints.  Contractions: Not present. Vag. Bleeding: None.  Movement: Present. Denies leaking of fluid/ROM.   The following portions of the patient's history were reviewed and updated as appropriate: allergies, current medications, past family history, past medical history, past social history, past surgical history and problem list. Problem list updated.  Objective:   Vitals:   04/20/21 1423  BP: (!) 91/53  Pulse: 90  Temp: 98.4 F (36.9 C)  Weight: 176 lb 9.6 oz (80.1 kg)    Fetal Status: Fetal Heart Rate (bpm): 150 Fundal Height: 30 cm Movement: Present     General:  Alert, oriented and cooperative. Patient is in no acute distress.  Skin: Skin is warm and dry. No rash noted.   Cardiovascular: Normal heart rate noted  Respiratory: Normal respiratory effort, no problems with respiration noted  Abdomen: Soft, gravid, appropriate for gestational age.  Pain/Pressure: Absent     Pelvic: Cervical exam deferred        Extremities: Normal range of motion.  Edema: None  Mental Status: Normal mood and affect. Normal behavior. Normal judgment and thought content.   Assessment and Plan:  Pregnancy: G2P1001 at [redacted]w[redacted]d  1. Supervision of other normal pregnancy, antepartum Working 10 hour days and 40 hours/wk 1 hour glucola today - Hemoglobin,  fingerstick - Tdap vaccine greater than or equal to 7yo IM - Glucose, 1 hour gestational - HIV-1/HIV-2 Qualitative RNA - RPR  2. 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 Obesity during pregnancy, antepartum Taking ASA 81 mg daily Walking 4x/wk x 20 min 16 lb 9.6 oz (7.53 kg)    Preterm labor symptoms and general obstetric precautions including but not limited to vaginal bleeding, contractions, leaking of fluid and fetal movement were reviewed in detail with the patient. Please refer to After Visit Summary for other counseling recommendations.  No follow-ups on file.  No future appointments.  Herbie Saxon, CNM

## 2021-04-20 NOTE — Progress Notes (Signed)
Hgb reviewed during clinic visit - no treatment indicated.   Adalberto Cole, RN

## 2021-04-21 LAB — RPR: RPR Ser Ql: NONREACTIVE

## 2021-04-21 LAB — GLUCOSE, 1 HOUR GESTATIONAL: Gestational Diabetes Screen: 112 mg/dL (ref 70–139)

## 2021-04-22 LAB — HIV-1/HIV-2 QUALITATIVE RNA
HIV-1 RNA, Qualitative: NONREACTIVE
HIV-2 RNA, Qualitative: NONREACTIVE

## 2021-05-04 ENCOUNTER — Ambulatory Visit: Payer: Medicaid Other

## 2021-05-04 ENCOUNTER — Telehealth: Payer: Self-pay

## 2021-05-04 NOTE — Telephone Encounter (Signed)
Client [facility]NK[facility] in [facility] for RV today. Call to client to reschedule missed [facility] RV appt. Per client, must have appt on a Friday. As agency closed 05/11/2021, appt scheduled for 05/18/2021. Rich Number, RN

## 2021-05-18 ENCOUNTER — Ambulatory Visit: Payer: Medicaid Other | Admitting: Physician Assistant

## 2021-05-18 ENCOUNTER — Other Ambulatory Visit: Payer: Self-pay

## 2021-05-18 ENCOUNTER — Encounter: Payer: Self-pay | Admitting: Physician Assistant

## 2021-05-18 VITALS — BP 101/64 | HR 70 | Temp 98.0°F | Wt 182.4 lb

## 2021-05-18 DIAGNOSIS — O99210 Obesity complicating pregnancy, unspecified trimester: Secondary | ICD-10-CM

## 2021-05-18 DIAGNOSIS — Z3480 Encounter for supervision of other normal pregnancy, unspecified trimester: Secondary | ICD-10-CM

## 2021-05-18 DIAGNOSIS — O283 Abnormal ultrasonic finding on antenatal screening of mother: Secondary | ICD-10-CM

## 2021-05-18 NOTE — Progress Notes (Signed)
[place] [facility]  PRENATAL VISIT NOTE  Subjective:  Caroline Cooke is a 25 y.o. G2P1001 at [redacted]w[redacted]d being seen today for ongoing prenatal care.  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  Patient reports no complaints.  Contractions: Not present. Vag. Bleeding: None.  Movement: Present. Denies leaking of fluid/ROM.   The following portions of the patient's history were reviewed and updated as appropriate: allergies, current medications, past family history, past medical history, past social history, past surgical history and problem list. Problem list updated.  Objective:   Vitals:   05/18/21 0940  BP: 101/64  Pulse: 70  Temp: 98 F (36.7 C)  Weight: 182 lb 6.4 oz (82.7 kg)    Fetal Status: Fetal Heart Rate (bpm): 132 Fundal Height: 34 cm Movement: Present     General:  Alert, oriented and cooperative. Patient is in no acute distress.  Skin: Skin is warm and dry. No rash noted.   Cardiovascular: Normal heart rate noted  Respiratory: Normal respiratory effort, no problems with respiration noted  Abdomen: Soft, gravid, appropriate for gestational age.  Pain/Pressure: Absent     Pelvic: Cervical exam deferred        Extremities: Normal range of motion.  Edema: None  Mental Status: Normal mood and affect. Normal behavior. Normal judgment and thought content.   Assessment and Plan:  Pregnancy: G2P1001 at [redacted]w[redacted]d  1. Supervision of high risk pregnancy, antepartum Feeling well. Continue current care.  2. Obesity during pregnancy, antepartum 22 lb TWG  noted, over desirable - enc walking and reasonable portion sizes, water as primary beverage.  3. 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   Preterm labor symptoms and general obstetric precautions including but not limited to vaginal bleeding, contractions, leaking of fluid and fetal movement were reviewed in detail with the patient. Please refer to After Visit Summary for other counseling recommendations.  Return in about 2 weeks (around 06/01/2021) for Routine prenatal care.  Future Appointments  Date Time Provider [place]  06/01/2021  8:20 AM [facility] PROVIDER AC-[facility] None    Lora Havens, PA-C

## 2021-05-18 NOTE — Progress Notes (Signed)
Patient here for MH RV at 32 weeks. Kick counts reviewed and cards given.Jenetta Downer, RN

## 2021-05-31 ENCOUNTER — Encounter: Payer: Self-pay | Admitting: Family Medicine

## 2021-06-01 ENCOUNTER — Encounter: Payer: Self-pay | Admitting: Family Medicine

## 2021-06-01 ENCOUNTER — Ambulatory Visit: Payer: Medicaid Other | Admitting: Advanced Practice Midwife

## 2021-06-01 ENCOUNTER — Other Ambulatory Visit: Payer: Self-pay

## 2021-06-01 VITALS — BP 96/56 | HR 83 | Temp 97.8°F | Wt 182.8 lb

## 2021-06-01 DIAGNOSIS — O283 Abnormal ultrasonic finding on antenatal screening of mother: Secondary | ICD-10-CM

## 2021-06-01 DIAGNOSIS — O0990 Supervision of high risk pregnancy, unspecified, unspecified trimester: Secondary | ICD-10-CM

## 2021-06-01 DIAGNOSIS — O99210 Obesity complicating pregnancy, unspecified trimester: Secondary | ICD-10-CM

## 2021-06-01 NOTE — Progress Notes (Signed)
Work medical leave for post-partum leave has been filled by provider, Ola Spurr, CNM.   Patient called to stop by maternity clinic to pick up copy.   Fax send to [employer] at [phone] with the filled application on 6/43/32.   Adalberto Cole, RN

## 2021-06-01 NOTE — Progress Notes (Unsigned)
[place] Conference Date: 06/01/21  Caroline Cooke was identified by clinical staff to benefit from an interdisciplinary team approach to help improve pregnancy care.  The [place] includes the maternity clinic coordinator (RN), medical providers (MD/APP staff), Care Management -OBCM and Healthy Beginnings, Centering Pregnancy coordinator, Infant Mortality reduction Geophysical data processor.  Nursing staff are also encouraged to participate. The group meets monthly to discuss patient care and coordinate services.   The patient's care care at the agency was reviewed in EMR and high risk factors evaluated in an interdisciplinary approach.    Value added interventions discussed at this care conference today were:  [facility] Meeting 05/31/21 Fetal abnormality Patient is open to care management  Insurance is unknown at the moment  L.Terisa Starr

## 2021-06-01 NOTE — Progress Notes (Signed)
[place] [facility]  PRENATAL VISIT NOTE  Subjective:  Kalisha Keadle is a 26 y.o. G2P1001 at [redacted]w[redacted]d being seen today for ongoing prenatal care.  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  Patient reports no complaints.   .  .   . Denies leaking of fluid/ROM.   The following portions of the patient's history were reviewed and updated as appropriate: allergies, current medications, past family history, past medical history, past social history, past surgical history and problem list. Problem list updated.  Objective:   Vitals:   06/01/21 0815  BP: (!) 96/56  Pulse: 83  Temp: 97.8 F (36.6 C)  Weight: 182 lb 12.8 oz (82.9 kg)    Fetal Status:           General:  Alert, oriented and cooperative. Patient is in no acute distress.  Skin: Skin is warm and dry. No rash noted.   Cardiovascular: Normal heart rate noted  Respiratory: Normal respiratory effort, no problems with respiration noted  Abdomen: Soft, gravid, appropriate for gestational age.        Pelvic: Cervical exam deferred        Extremities: Normal range of motion.     Mental Status: Normal mood and affect. Normal behavior. Normal judgment and thought content.   Assessment and Plan:  Pregnancy: G2P1001 at [redacted]w[redacted]d  1. Supervision of high risk pregnancy, antepartum Has car seat Working 40 hrs/wk 10 hour days and prefers that Reviewed 04/24/21 u/s at 29.0 wks with anterior placenta and AFI wnl Paperwork completed for maternity leave x 11 wks   2. 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  F/u u/s done for growth 05/22/21 with growth wnl, AFI wnl, EFW=34%, at 32 6/7 with right kidney uterocele, duplicated right collecting system with ureterocele. [place] [facility]rology telehealth apt on 05/22/21 with plan for renal bladder u/s on 2nd day of life with voiding cystourethrogram, prophlyactic Amoxicillin. At birth contact urologist Valeta Harms, MD p: [phone]  3. Obesity during pregnancy, antepartum BMI=35.1 22 lb 12.8 oz (10.3 kg) Not exercising Taking ASA daily   Preterm labor symptoms and general obstetric precautions including but not limited to vaginal bleeding, contractions, leaking of fluid and fetal movement were reviewed in detail with the patient. Please refer to After Visit Summary for other counseling recommendations.  Return in about 2 weeks (around 06/15/2021) for routine PNC.  No future appointments.  Herbie Saxon, CNM

## 2021-06-15 ENCOUNTER — Ambulatory Visit: Payer: Medicaid Other

## 2021-06-19 ENCOUNTER — Telehealth: Payer: Self-pay | Admitting: Family Medicine

## 2021-06-19 NOTE — Telephone Encounter (Signed)
Pt was called and rescheduled for 2/10th

## 2021-06-29 ENCOUNTER — Other Ambulatory Visit: Payer: Self-pay

## 2021-06-29 ENCOUNTER — Ambulatory Visit: Payer: Medicaid Other | Admitting: Advanced Practice Midwife

## 2021-06-29 VITALS — BP 102/61 | HR 83 | Temp 97.5°F | Wt 187.0 lb

## 2021-06-29 DIAGNOSIS — O0990 Supervision of high risk pregnancy, unspecified, unspecified trimester: Secondary | ICD-10-CM

## 2021-06-29 DIAGNOSIS — O0993 Supervision of high risk pregnancy, unspecified, third trimester: Secondary | ICD-10-CM

## 2021-06-29 NOTE — Progress Notes (Signed)
[place] [facility]  PRENATAL VISIT NOTE  Subjective:  Caroline Cooke is a 26 y.o. G2P1001 at [redacted]w[redacted]d being seen today for ongoing prenatal care.  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  Patient reports no complaints.   .  .   . Denies leaking of fluid/ROM.   The following portions of the patient's history were reviewed and updated as appropriate: allergies, current medications, past family history, past medical history, past social history, past surgical history and problem list. Problem list updated.  Objective:   Vitals:   06/29/21 1039  BP: 102/61  Pulse: 83  Temp: (!) 97.5 F (36.4 C)  Weight: 187 lb (84.8 kg)    Fetal Status:   Fundal Height: 38 cm       General:  Alert, oriented and cooperative. Patient is in no acute distress.  Skin: Skin is warm and dry. No rash noted.   Cardiovascular: Normal heart rate noted  Respiratory: Normal respiratory effort, no problems with respiration noted  Abdomen: Soft, gravid, appropriate for gestational age.        Pelvic: Cervical exam deferred        Extremities: Normal range of motion.  Edema: None  Mental Status: Normal mood and affect. Normal behavior. Normal judgment and thought content.   Assessment and Plan:  Pregnancy: G2P1001 at [redacted]w[redacted]d  1. Supervision of high risk pregnancy, antepartum Hasn't been here x 4 wks ( since 34 wks) so GC/Chlamydia/GBS/ wet mount done today Knows when to go to L&D Has car seat and ready for baby at home 27 lb (12.2 kg) 5 lb wt gain in past 4 wks Working 40  hrs/wk Walking 2-3x/wk x 20-30 min. Stopped taking ASA daily - Culture, beta strep (group b only) - Chlamydia/GC NAA, Confirmation - WET PREP FOR TRICH, YEAST, CLUE   Term labor symptoms and general obstetric precautions including but not limited to vaginal bleeding, contractions, leaking of fluid and fetal movement were reviewed in detail with the patient. Please refer to After Visit Summary for other counseling recommendations.  No follow-ups on file.  No future appointments.  Herbie Saxon, CNM

## 2021-06-29 NOTE — Progress Notes (Signed)
Patient requested PNV. PNV order not done before. Provider made aware. Physical RX for PNV handed to patient. Copy made as well.   Adalberto Cole, RN

## 2021-06-29 NOTE — Progress Notes (Signed)
Per verbal order of E. Sciora CNM, discontinue wet prep. [facility] in [place] lab notified and test not performed. Client scheduled 1 week MHC RV appt for next Friday. Rich Number, RN

## 2021-06-29 NOTE — Addendum Note (Signed)
Addended by: Zollie Beckers on: 06/29/2021 11:55 AM   Modules accepted: Orders

## 2021-07-02 ENCOUNTER — Encounter: Payer: Self-pay | Admitting: Family Medicine

## 2021-07-02 NOTE — Progress Notes (Unsigned)
Patient ID: Eugenio Hoes, female   DOB: 1995-06-19, 26 y.o.   MRN: [id number] [place] Conference Date: 07/02/21  Eniyah Calero-Morales was identified by clinical staff to benefit from an interdisciplinary team approach to help improve pregnancy care.  The [place] includes the maternity clinic coordinator (RN), medical providers (MD/APP staff), Care Management -OBCM and Healthy Beginnings, Centering Pregnancy coordinator, Infant Mortality reduction Geophysical data processor.  Nursing staff are also encouraged to participate. The group meets monthly to discuss patient care and coordinate services.   The patient's care care at the agency was reviewed in EMR and high risk factors evaluated in an interdisciplinary approach.    Value added interventions discussed at this care conference today were:   [facility] 2/9 Patient os over due to be seen in clinic  Has appt set on 2/10 Refer to [facility] after delivery L.Terisa Starr

## 2021-07-03 LAB — CHLAMYDIA/GC NAA, CONFIRMATION
Chlamydia trachomatis, NAA: NEGATIVE
Neisseria gonorrhoeae, NAA: NEGATIVE

## 2021-07-04 LAB — CULTURE, BETA STREP (GROUP B ONLY): Strep Gp B Culture: NEGATIVE

## 2021-07-06 ENCOUNTER — Ambulatory Visit: Payer: Medicaid Other | Admitting: Advanced Practice Midwife

## 2021-07-06 ENCOUNTER — Other Ambulatory Visit: Payer: Self-pay

## 2021-07-06 VITALS — BP 99/56 | HR 86 | Temp 97.6°F | Wt 186.6 lb

## 2021-07-06 DIAGNOSIS — O99210 Obesity complicating pregnancy, unspecified trimester: Secondary | ICD-10-CM

## 2021-07-06 DIAGNOSIS — O0990 Supervision of high risk pregnancy, unspecified, unspecified trimester: Secondary | ICD-10-CM

## 2021-07-06 DIAGNOSIS — O283 Abnormal ultrasonic finding on antenatal screening of mother: Secondary | ICD-10-CM

## 2021-07-06 NOTE — Progress Notes (Addendum)
[place] [facility]  PRENATAL VISIT NOTE  Subjective:  Caroline Cooke is a 26 y.o. G2P1001 at [redacted]w[redacted]d being seen today for ongoing prenatal care.  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  Patient reports no complaints.   .  .   . Denies leaking of fluid/ROM.   The following portions of the patient's history were reviewed and updated as appropriate: allergies, current medications, past family history, past medical history, past social history, past surgical history and problem list. Problem list updated.  Objective:   Vitals:   07/06/21 0907  BP: (!) 99/56  Pulse: 86  Temp: 97.6 F (36.4 C)  Weight: 186 lb 9.6 oz (84.6 kg)    Fetal Status:           General:  Alert, oriented and cooperative. Patient is in no acute distress.  Skin: Skin is warm and dry. No rash noted.   Cardiovascular: Normal heart rate noted  Respiratory: Normal respiratory effort, no problems with respiration noted  Abdomen: Soft, gravid, appropriate for gestational age.        Pelvic: Cervical exam performed        Extremities: Normal range of motion.     Mental Status: Normal mood and affect. Normal behavior. Normal judgment and thought content.   Assessment and Plan:  Pregnancy: G2P1001 at [redacted]w[redacted]d  1. Supervision of high risk pregnancy, antepartum Not working anymore--yesterday was last day Has car seat and ready for baby at home; knows when to go to L&D IOL paperwork completed--desires 07/20/21 GC/Chlamydia/GBS from 06/29/21 neg  2. Obesity during pregnancy, antepartum  BMI=35.1 26 lb 9.6 oz (12.1 kg)   3. 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 Please refer to After Visit Summary for other counseling recommendations.  No follow-ups on file.  No future appointments.  Herbie Saxon, CNM

## 2021-07-09 ENCOUNTER — Telehealth: Payer: Self-pay

## 2021-07-09 NOTE — Telephone Encounter (Signed)
Call to client with [facility] [place] [facility] and notified of [facility] IOL appt on 07/22/2021. Client aware she will receive a call from [facility] between 1100 - 1400 with arrival time. Client verbalized understanding. Rich Number, RN

## 2021-07-13 ENCOUNTER — Ambulatory Visit: Payer: Medicaid Other | Admitting: Nurse Practitioner

## 2021-07-13 ENCOUNTER — Other Ambulatory Visit: Payer: Self-pay

## 2021-07-13 VITALS — BP 95/55 | HR 81 | Temp 98.0°F | Wt 187.2 lb

## 2021-07-13 DIAGNOSIS — O0990 Supervision of high risk pregnancy, unspecified, unspecified trimester: Secondary | ICD-10-CM

## 2021-07-13 DIAGNOSIS — O99210 Obesity complicating pregnancy, unspecified trimester: Secondary | ICD-10-CM

## 2021-07-13 DIAGNOSIS — O0993 Supervision of high risk pregnancy, unspecified, third trimester: Secondary | ICD-10-CM

## 2021-07-13 DIAGNOSIS — O283 Abnormal ultrasonic finding on antenatal screening of mother: Secondary | ICD-10-CM

## 2021-07-13 DIAGNOSIS — O99213 Obesity complicating pregnancy, third trimester: Secondary | ICD-10-CM

## 2021-07-13 NOTE — Progress Notes (Signed)
Patient here for MH RV at 40 weeks. Aware of [facility] IOL 07/22/21 and will receive call between 11am-2pm. NST scheduled for 07/20/21 at 41 weeks.Marland KitchenMarland KitchenJenetta Downer, RN

## 2021-07-13 NOTE — Progress Notes (Signed)
[place] [facility]  PRENATAL VISIT NOTE  Subjective:  Caroline Cooke is a 26 y.o. G2P1001 at [redacted]w[redacted]d being seen today for ongoing prenatal care.  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  Patient reports  experiencing a little lower abdominal pressure and cramping last night .  Contractions: Irregular. Vag. Bleeding: None.  Movement: Present. Denies leaking of fluid/ROM.   The following portions of the patient's history were reviewed and updated as appropriate: allergies, current medications, past family history, past medical history, past social history, past surgical history and problem list. Problem list updated.  Objective:   Vitals:   07/13/21 0957  BP: (!) 95/55  Pulse: 81  Temp: 98 F (36.7 C)  Weight: 187 lb 3.2 oz (84.9 kg)    Fetal Status: Fetal Heart Rate (bpm): 140 Fundal Height: 40 cm Movement: Present  Presentation: Vertex  General:  Alert, oriented and cooperative. Patient is in no acute distress.  Skin: Skin is warm and dry. No rash noted.   Cardiovascular: Normal heart rate noted  Respiratory: Normal respiratory effort, no problems with respiration noted  Abdomen: Soft, gravid, appropriate for gestational age.  Pain/Pressure: Present     Pelvic: Cervical exam performed Dilation: 1 Effacement (%): Thick Station: -3  Extremities: Normal range of motion.  Edema: Trace  Mental Status: Normal mood and affect. Normal behavior. Normal judgment and thought content.   Assessment and Plan:  Pregnancy: G2P1001 at [redacted]w[redacted]d  1. Supervision  of high risk pregnancy, antepartum -26 year old female in clinic today for her prenatal visit. -Patient states she is taking her PNV daily. -IOL 07/22/21, paperwork completed at previous visit.   Patient scheduled for MH revisit and NST on 07/20/21. -Patient reports some cramping and pressure last night.  Reviewed with patient signs and symptoms of when to report to the hospital.    2. Obesity during pregnancy, antepartum BMI=35.1 -Total weight gain= 27 lb 3.2 oz (12.3 kg)  -Encouraged patient to continue to exercise and drink plenty of fluids.   3. 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     Term labor symptoms and general obstetric precautions including but not limited to vaginal bleeding, contractions, leaking of fluid and fetal movement were reviewed in detail with the patient. Please refer to After Visit Summary for other counseling recommendations.   Return in about 1 week (around 07/20/2021) for Routine prenatal care visit.  Future Appointments  Date Time Provider [place]  07/20/2021  8:20 AM [facility] PROVIDER AC-MAT None    Gregary Cromer, FNP

## 2021-07-15 DIAGNOSIS — O0993 Supervision of high risk pregnancy, unspecified, third trimester: Secondary | ICD-10-CM | POA: Diagnosis not present

## 2021-07-20 ENCOUNTER — Ambulatory Visit: Payer: Medicaid Other

## 2021-07-23 ENCOUNTER — Encounter: Payer: Self-pay | Admitting: Advanced Practice Midwife

## 2021-07-27 ENCOUNTER — Telehealth: Payer: Self-pay

## 2021-07-27 NOTE — Telephone Encounter (Signed)
Call to client with [facility] [employer] ID# [phone] to notify her FMLA / benefits disability paperwork has been completed by provider. Client counseled that ROI to fax to insurance company needs to be signed and aware can walk in to sign ROI (hours provided) and then RN will fax paperwork and give her original (copy of paperwork has been labeled and sent for scanning on 07/26/2021). Rich Number, RN ? ?

## 2021-08-27 ENCOUNTER — Ambulatory Visit: Payer: Medicaid Other

## 2021-09-07 ENCOUNTER — Telehealth: Payer: Self-pay | Admitting: Family Medicine

## 2021-09-07 NOTE — Telephone Encounter (Signed)
Called pt left message to call back to schedule pp appt  ?

## 2021-09-10 NOTE — Telephone Encounter (Signed)
Telephone call to patient today to schedule her pp visit.  Appointment is 09-20-2021 at 9 am (8:30 arrival time).  Patient aware if appointment is not scheduled appropriately with provider, we will call back.  Language Line used today.  Caroline Cooke / [phone].  Dahlia Bailiff, RN ? ?

## 2021-09-20 ENCOUNTER — Ambulatory Visit: Payer: Medicaid Other
